# Patient Record
Sex: Male | Born: 1970 | Race: White | Hispanic: No | Marital: Married | Smoking: Never smoker
Health system: Southern US, Community
[De-identification: ages and names within clinical notes are randomized; demographics above are authoritative.]

---

## 2015-05-07 ENCOUNTER — Encounter: Payer: Self-pay | Admitting: Emergency Medicine

## 2015-05-07 ENCOUNTER — Emergency Department: Payer: BLUE CROSS/BLUE SHIELD

## 2015-05-07 ENCOUNTER — Observation Stay
Admission: EM | Admit: 2015-05-07 | Discharge: 2015-05-08 | Disposition: A | Discharge status: Home / Self Care | Payer: BLUE CROSS/BLUE SHIELD | Attending: Internal Medicine | Admitting: Internal Medicine

## 2015-05-07 DIAGNOSIS — Z882 Allergy status to sulfonamides status: Secondary | ICD-10-CM | POA: Diagnosis not present

## 2015-05-07 DIAGNOSIS — I208 Other forms of angina pectoris: Secondary | ICD-10-CM | POA: Diagnosis present

## 2015-05-07 DIAGNOSIS — J449 Chronic obstructive pulmonary disease, unspecified: Secondary | ICD-10-CM | POA: Diagnosis not present

## 2015-05-07 DIAGNOSIS — R0789 Other chest pain: Principal | ICD-10-CM | POA: Insufficient documentation

## 2015-05-07 DIAGNOSIS — R079 Chest pain, unspecified: Secondary | ICD-10-CM | POA: Diagnosis present

## 2015-05-07 DIAGNOSIS — Z79899 Other long term (current) drug therapy: Secondary | ICD-10-CM | POA: Insufficient documentation

## 2015-05-07 DIAGNOSIS — J45909 Unspecified asthma, uncomplicated: Secondary | ICD-10-CM

## 2015-05-07 DIAGNOSIS — R000 Tachycardia, unspecified: Secondary | ICD-10-CM | POA: Diagnosis not present

## 2015-05-07 DIAGNOSIS — E876 Hypokalemia: Secondary | ICD-10-CM

## 2015-05-07 DIAGNOSIS — R739 Hyperglycemia, unspecified: Secondary | ICD-10-CM

## 2015-05-07 DIAGNOSIS — R002 Palpitations: Secondary | ICD-10-CM | POA: Diagnosis present

## 2015-05-07 LAB — COMPREHENSIVE METABOLIC PANEL
ALT: 21 U/L (ref 17–63)
AST: 29 U/L (ref 15–41)
Albumin: 4.8 g/dL (ref 3.5–5.0)
Alkaline Phosphatase: 47 U/L (ref 38–126)
Anion gap: 9 (ref 5–15)
BILIRUBIN TOTAL: 0.8 mg/dL (ref 0.3–1.2)
BUN: 17 mg/dL (ref 6–20)
CHLORIDE: 105 mmol/L (ref 101–111)
CO2: 25 mmol/L (ref 22–32)
CREATININE: 1.06 mg/dL (ref 0.61–1.24)
Calcium: 9.2 mg/dL (ref 8.9–10.3)
Glucose, Bld: 114 mg/dL — ABNORMAL HIGH (ref 65–99)
POTASSIUM: 3 mmol/L — AB (ref 3.5–5.1)
Sodium: 139 mmol/L (ref 135–145)
TOTAL PROTEIN: 8.2 g/dL — AB (ref 6.5–8.1)

## 2015-05-07 LAB — TSH: TSH: 2.552 u[IU]/mL (ref 0.350–4.500)

## 2015-05-07 LAB — CBC
HCT: 40.4 % (ref 40.0–52.0)
Hemoglobin: 14.1 g/dL (ref 13.0–18.0)
MCH: 31.7 pg (ref 26.0–34.0)
MCHC: 34.8 g/dL (ref 32.0–36.0)
MCV: 91 fL (ref 80.0–100.0)
PLATELETS: 207 10*3/uL (ref 150–440)
RBC: 4.44 MIL/uL (ref 4.40–5.90)
RDW: 13 % (ref 11.5–14.5)
WBC: 5.9 10*3/uL (ref 3.8–10.6)

## 2015-05-07 LAB — TROPONIN I

## 2015-05-07 LAB — MAGNESIUM: Magnesium: 2.1 mg/dL (ref 1.7–2.4)

## 2015-05-07 MED ORDER — ALBUTEROL SULFATE (2.5 MG/3ML) 0.083% IN NEBU
2.5000 mg | INHALATION_SOLUTION | Freq: Four times a day (QID) | RESPIRATORY_TRACT | Status: DC
  Filled 2015-05-07 (×2): qty 3

## 2015-05-07 MED ORDER — ONDANSETRON HCL 4 MG/2ML IJ SOLN: 4.0000 mg | Freq: Four times a day (QID) | INTRAMUSCULAR | Status: DC | PRN

## 2015-05-07 MED ORDER — SODIUM CHLORIDE 0.9% FLUSH
3.0000 mL | Freq: Two times a day (BID) | INTRAVENOUS | Status: DC
  Administered 2015-05-07: 3 mL via INTRAVENOUS

## 2015-05-07 MED ORDER — NITROGLYCERIN 2 % TD OINT
0.5000 [in_us] | TOPICAL_OINTMENT | Freq: Three times a day (TID) | TRANSDERMAL | Status: DC
  Administered 2015-05-07: 0.5 [in_us] via TOPICAL
  Filled 2015-05-07 (×2): qty 1

## 2015-05-07 MED ORDER — SODIUM CHLORIDE 0.9 % IV SOLN: 250.0000 mL | INTRAVENOUS | Status: DC | PRN

## 2015-05-07 MED ORDER — SODIUM CHLORIDE 0.9% FLUSH: 3.0000 mL | INTRAVENOUS | Status: DC | PRN

## 2015-05-07 MED ORDER — POTASSIUM CHLORIDE CRYS ER 20 MEQ PO TBCR
40.0000 meq | EXTENDED_RELEASE_TABLET | Freq: Once | ORAL | Status: AC
  Administered 2015-05-07: 40 meq via ORAL
  Filled 2015-05-07: qty 2

## 2015-05-07 MED ORDER — ONDANSETRON HCL 4 MG PO TABS: 4.0000 mg | ORAL_TABLET | Freq: Four times a day (QID) | ORAL | Status: DC | PRN

## 2015-05-07 MED ORDER — ASPIRIN 81 MG PO CHEW
324.0000 mg | CHEWABLE_TABLET | Freq: Once | ORAL | Status: AC
  Administered 2015-05-07: 324 mg via ORAL
  Filled 2015-05-07: qty 4

## 2015-05-07 MED ORDER — METOPROLOL TARTRATE 25 MG PO TABS
12.5000 mg | ORAL_TABLET | Freq: Two times a day (BID) | ORAL | Status: DC
  Administered 2015-05-07 – 2015-05-08 (×2): 12.5 mg via ORAL
  Filled 2015-05-07 (×3): qty 1

## 2015-05-07 MED ORDER — ASPIRIN EC 81 MG PO TBEC
81.0000 mg | DELAYED_RELEASE_TABLET | Freq: Every day | ORAL | Status: DC
  Administered 2015-05-07 – 2015-05-08 (×2): 81 mg via ORAL
  Filled 2015-05-07: qty 1

## 2015-05-07 MED ORDER — NITROGLYCERIN 2 % TD OINT
1.0000 [in_us] | TOPICAL_OINTMENT | Freq: Once | TRANSDERMAL | Status: AC
  Administered 2015-05-07: 1 [in_us] via TOPICAL
  Filled 2015-05-07: qty 1

## 2015-05-07 MED ORDER — ACETAMINOPHEN 650 MG RE SUPP: 650.0000 mg | Freq: Four times a day (QID) | RECTAL | Status: DC | PRN

## 2015-05-07 MED ORDER — ENOXAPARIN SODIUM 40 MG/0.4ML ~~LOC~~ SOLN
40.0000 mg | SUBCUTANEOUS | Status: DC
  Administered 2015-05-07: 40 mg via SUBCUTANEOUS
  Filled 2015-05-07: qty 0.4

## 2015-05-07 MED ORDER — ACETAMINOPHEN 325 MG PO TABS
650.0000 mg | ORAL_TABLET | Freq: Four times a day (QID) | ORAL | Status: DC | PRN
  Administered 2015-05-08: 650 mg via ORAL
  Filled 2015-05-07: qty 2

## 2015-05-07 MED ORDER — SODIUM CHLORIDE 0.9% FLUSH
3.0000 mL | Freq: Two times a day (BID) | INTRAVENOUS | Status: DC
  Administered 2015-05-07 – 2015-05-08 (×2): 3 mL via INTRAVENOUS

## 2015-05-07 NOTE — ED Notes (Signed)
Pt reports [facility] for heart palpitations and fluttering sensation. Pt sent here for acute MI.

## 2015-05-07 NOTE — ED Notes (Signed)
Dr. Winona LegatoVaickute at bedside.

## 2015-05-07 NOTE — ED Notes (Signed)
Dr. Mayford KnifeWilliams at bedside.  Not a STEMI.

## 2015-05-07 NOTE — H&P (Signed)
[facility] - [place] at [facility]   PATIENT NAME: Manuel Cabrera    MR#:  [id number]  DATE OF BIRTH:  [birth year]  DATE OF ADMISSION:  05/07/2015  PRIMARY CARE PHYSICIAN: Marisue Ivan, MD   REQUESTING/REFERRING PHYSICIAN:   CHIEF COMPLAINT:   Chief Complaint  Patient presents with  . Palpitations    HISTORY OF PRESENT ILLNESS: Manuel Cabrera  is a 45 y.o. male with a known history of childhood asthma, who presents to the hospital with complaints of intermittent chest pains, palpitations. According to the patient,  he was doing well up until a few weeks ago when he started having intermittent palpitations, spreading to his heart, lasting for a few seconds, feeling like his heart would stop , flutter . Episodes of palpitations would last only a few seconds and would go away. 3 weeks ago. However, he noted that it was very difficult for him to play basketball with his son, he had the pressure in his chest area and had increased heart rate, approximately week ago, he was carrying his child and the sided having chest pain again. He had to put child on the ground and walk really slow for chest pain to improve. Pain is described as pressure type discomfort and left side of the chest, usually 3-4 out of 10 by intensity discomfort lasting approximately 10 minutes or less constant with no radiation. There is no alleviating or aggravating factors except of exertion. Patient was seen by Dr. Burnadette Pop, ordered EKG in the office. EKG came back abnormal with ST, T elevations in anterior lateral leads and patient was referred to emergency room for further evaluation. EKG in the emergency room was unremarkable. Hospitalist services were contacted for admission  PAST MEDICAL HISTORY:  History reviewed. No pertinent past medical history.  PAST SURGICAL HISTORY: History reviewed. No pertinent past surgical history.  SOCIAL HISTORY:  Social History  Substance Use Topics  .  Smoking status: Never Smoker   . Smokeless tobacco: Not on file  . Alcohol Use: No    FAMILY HISTORY: No early coronary artery disease, hypertension .  DRUG ALLERGIES:  Allergies  Allergen Reactions  . Sulfa Antibiotics Other (See Comments)    Reaction:  Unknown     Review of Systems  Constitutional: Negative for fever, chills, weight loss and malaise/fatigue.  HENT: Negative for congestion.   Eyes: Negative for blurred vision and double vision.  Respiratory: Negative for cough, sputum production, shortness of breath and wheezing.   Cardiovascular: Positive for chest pain and palpitations. Negative for orthopnea, leg swelling and PND.  Gastrointestinal: Negative for nausea, vomiting, abdominal pain, diarrhea, constipation, blood in stool and melena.  Genitourinary: Negative for dysuria, urgency, frequency and hematuria.  Musculoskeletal: Negative for falls.  Skin: Negative for rash.  Neurological: Negative for dizziness and weakness.  Psychiatric/Behavioral: Negative for depression and memory loss. The patient is not nervous/anxious.     MEDICATIONS AT HOME:  Prior to Admission medications   Medication Sig Start Date End Date Taking? Authorizing Provider  naproxen sodium (ANAPROX) 220 MG tablet Take 440 mg by mouth 2 (two) times daily as needed (for pain).   Yes Historical Provider, MD      PHYSICAL EXAMINATION:   VITAL SIGNS: Blood pressure 122/73, pulse 83, temperature 98.2 F (36.8 C), temperature source Oral, resp. rate 18, height  (1.778 m), weight 71.8 kg (158 lb 4.6 oz), SpO2 99 %.  GENERAL:  45 y.o.-year-old patient lying in the bed with no acute  distress.  EYES: Pupils equal, round, reactive to light and accommodation. No scleral icterus. Extraocular muscles intact.  HEENT: Head atraumatic, normocephalic. Oropharynx and nasopharynx clear.  NECK:  Supple, no jugular venous distention. No thyroid enlargement, no tenderness.  LUNGS: Normal breath sounds  bilaterally, no wheezing, rales,rhonchi or crepitation. No use of accessory muscles of respiration.  CARDIOVASCULAR: S1, S2 normal. No murmurs, rubs, or gallops.  ABDOMEN: Soft, nontender, nondistended. Bowel sounds present. No organomegaly or mass.  EXTREMITIES: No pedal edema, cyanosis, or clubbing.  NEUROLOGIC: Cranial nerves II through XII are intact. Muscle strength 5/5 in all extremities. Sensation intact. Gait not checked.  PSYCHIATRIC: The patient is alert and oriented x 3.  SKIN: No obvious rash, lesion, or ulcer.   LABORATORY PANEL:   CBC  Recent Labs Lab 05/07/15 1239  WBC 5.9  HGB 14.1  HCT 40.4  PLT 207  MCV 91.0  MCH 31.7  MCHC 34.8  RDW 13.0   ------------------------------------------------------------------------------------------------------------------  Chemistries   Recent Labs Lab 05/07/15 1239  NA 139  K 3.0*  CL 105  CO2 25  GLUCOSE 114*  BUN 17  CREATININE 1.06  CALCIUM 9.2  AST 29  ALT 21  ALKPHOS 47  BILITOT 0.8   ------------------------------------------------------------------------------------------------------------------  Cardiac Enzymes  Recent Labs Lab 05/07/15 1239  TROPONINI <0.03   ------------------------------------------------------------------------------------------------------------------  RADIOLOGY: Dg Chest Port 1 View  05/07/2015  CLINICAL DATA:  Chest pain and heart palpitations for several days, abnormal EKG EXAM: PORTABLE CHEST 1 VIEW COMPARISON:  None. FINDINGS: The heart size and vascular pattern are normal. Lungs are clear. There appears to be an element of hyperinflation suggesting COPD. Costophrenic angles are not included on the image. IMPRESSION: COPD with no acute abnormalities Electronically Signed   By: Esperanza Heiraymond  Rubner M.D.   On: 05/07/2015 14:21    EKG: Orders placed or performed during the hospital encounter of 05/07/15  . EKG 12-Lead  . EKG 12-Lead  . EKG 12-Lead  . EKG 12-Lead  . ED EKG  .  ED EKG  . ED EKG  . ED EKG    IMPRESSION AND PLAN:  Active Problems:   Chest pain  #1. Chest pain, exertional, rule out cardiac. Admit patient to medical floor. Initiate him on full dose of aspirin, metoprolol, nitroglycerin and Lovenox, get Myoview stress test in the morning #2, palpitations, continue telemetry, unremarkable TSH, get echo, ged cardiologist involved for further recommendations #3 . Hyperglycemia, get a hemoglobin A1c to rule out diabetes. #4. Hypokalemia, supplement orally, get magnesium level checked   All the records are reviewed and case discussed with ED provider. Management plans discussed with the patient, family and they are in agreement.  CODE STATUS: Code Status History    This patient does not have a recorded code status. Please follow your organizational policy for patients in this situation.       TOTAL TIME TAKING CARE OF THIS PATIENT: 50 minutes.    Katharina CaperVAICKUTE,Alexus Piotr M.D on 05/07/2015 at 4:58 PM  Between 7am to 6pm - Pager - [phone] After 6pm go to [email or web address] - password [email or web address] [facility]  [facility]  Office  [phone]  CC: Primary care physician; Marisue IvanLINTHAVONG, KANHKA, MD

## 2015-05-07 NOTE — ED Provider Notes (Signed)
[facility] Emergency Department Provider Note     Time seen: ----------------------------------------- 1:05 PM on 05/07/2015 -----------------------------------------    I have reviewed the triage vital signs and the nursing notes.   HISTORY  Chief Complaint Palpitations    HPI Manuel Cabrera is a 45 y.o. male who presents to the ER for her palpitations and fluttering sensation. He was sent here for possible acute heart attack by his primary care doctor. Patient does describe occasional stable angina symptoms of chest pain with exertion or heavy physical activity in the past few weeks but has never had chest pain at rest. He does not have pain currently, no nausea or sweats with the symptoms.   History reviewed. No pertinent past medical history.  There are no active problems to display for this patient.   History reviewed. No pertinent past surgical history.  Allergies Sulfa antibiotics  Social History Social History  Substance Use Topics  . Smoking status: Never Smoker   . Smokeless tobacco: None  . Alcohol Use: No    Review of Systems Constitutional: Negative for fever. Eyes: Negative for visual changes. ENT: Negative for sore throat. Cardiovascular: Negative for chest pain. Positive for palpitations Respiratory: Negative for shortness of breath. Gastrointestinal: Negative for abdominal pain, vomiting and diarrhea. Genitourinary: Negative for dysuria. Musculoskeletal: Negative for back pain. Skin: Negative for rash. Neurological: Negative for headaches, focal weakness or numbness.  10-point ROS otherwise negative.  ____________________________________________   PHYSICAL EXAM:  VITAL SIGNS: ED Triage Vitals  Enc Vitals Group     BP 05/07/15 1241 156/85 mmHg     Pulse Rate 05/07/15 1241 104     Resp 05/07/15 1241 20     Temp 05/07/15 1241 98.2 F (36.8 C)     Temp Source 05/07/15 1241 Oral     SpO2 05/07/15 1241 98 %     Weight 05/07/15 1238 158 lb 4.6 oz (71.8 kg)     Height 05/07/15 1241  (1.778 m)     Head Cir --      Peak Flow --      Pain Score 05/07/15 1252 0     Pain Loc --      Pain Edu? --      Excl. in GC? --     Constitutional: Alert and oriented. Well appearing and in no distress. Eyes: Conjunctivae are normal. PERRL. Normal extraocular movements. ENT   Head: Normocephalic and atraumatic.   Nose: No congestion/rhinnorhea.   Mouth/Throat: Mucous membranes are moist.   Neck: No stridor. Cardiovascular: Normal rate, regular rhythm. Normal and symmetric distal pulses are present in all extremities. No murmurs, rubs, or gallops. Respiratory: Normal respiratory effort without tachypnea nor retractions. Breath sounds are clear and equal bilaterally. No wheezes/rales/rhonchi. Gastrointestinal: Soft and nontender. No distention. No abdominal bruits.  Musculoskeletal: Nontender with normal range of motion in all extremities. No joint effusions.  No lower extremity tenderness nor edema. Neurologic:  Normal speech and language. No gross focal neurologic deficits are appreciated. Speech is normal. No gait instability. Skin:  Skin is warm, dry and intact. No rash noted. Psychiatric: Mood and affect are normal. Speech and behavior are normal. Patient exhibits appropriate insight and judgment. ____________________________________________  EKG: Interpreted by me. Normal sinus rhythm with a rate of 77 bpm, normal PR interval, normal QRS, normal QT interval. J-point elevation in leads V3 through V6  ____________________________________________  ED COURSE:  Pertinent labs & imaging results that were available during my care of the patient  were reviewed by me and considered in my medical decision making (see chart for details). Patient is in no acute distress, does not have symptomatology consistent with STEMI. We'll check cardiac labs and discuss with cardiology  ____________________________________________    LABS (pertinent positives/negatives)  Labs Reviewed  COMPREHENSIVE METABOLIC PANEL - Abnormal; Notable for the following:    Potassium 3.0 (*)    Glucose, Bld 114 (*)    Total Protein 8.2 (*)    All other components within normal limits  CBC  TROPONIN I  TSH    RADIOLOGY Images were viewed by me  Chest x-ray  IMPRESSION: COPD with no acute abnormalities  ____________________________________________  FINAL ASSESSMENT AND PLAN  Palpitations, stable angina  Plan: Patient with labs and imaging as dictated above. Patient is in no acute distress, I have discussed the EKG initially with Dr. Lady GaryFath who agrees this is not an ST elevation MI. Patient does exhibit some stable angina symptoms that are getting worse, he's been given aspirin and nitroglycerin. Would recommend observation, serial troponins and stress testing.   Emily FilbertWilliams, Dominic Mahaney E, MD   Emily FilbertJonathan E Jarboe, MD 05/07/15 1520

## 2015-05-08 ENCOUNTER — Observation Stay: Payer: BLUE CROSS/BLUE SHIELD

## 2015-05-08 ENCOUNTER — Observation Stay: Admit: 2015-05-08 | Payer: BLUE CROSS/BLUE SHIELD

## 2015-05-08 LAB — BASIC METABOLIC PANEL
ANION GAP: 3 — AB (ref 5–15)
BUN: 19 mg/dL (ref 6–20)
CALCIUM: 8.8 mg/dL — AB (ref 8.9–10.3)
CO2: 27 mmol/L (ref 22–32)
Chloride: 109 mmol/L (ref 101–111)
Creatinine, Ser: 1.07 mg/dL (ref 0.61–1.24)
GFR calc Af Amer: >60 mL/min (ref >60)
Glucose, Bld: 100 mg/dL — ABNORMAL HIGH (ref 65–99)
POTASSIUM: 4.3 mmol/L (ref 3.5–5.1)
SODIUM: 139 mmol/L (ref 135–145)

## 2015-05-08 LAB — LIPID PANEL
CHOL/HDL RATIO: 3.5 ratio
CHOLESTEROL: 186 mg/dL (ref 0–200)
HDL: 53 mg/dL (ref >40)
LDL Cholesterol: 116 mg/dL — ABNORMAL HIGH (ref 0–99)
Triglycerides: 83 mg/dL (ref <150)
VLDL: 17 mg/dL (ref 0–40)

## 2015-05-08 LAB — NM MYOCAR MULTI W/SPECT W/WALL MOTION / EF
CHL CUP NUCLEAR SDS: 0
LV dias vol: 64 mL
LVSYSVOL: 19 mL
SRS: 2
SSS: 0
TID: 0.95

## 2015-05-08 LAB — CBC
HEMATOCRIT: 36.1 % — AB (ref 40.0–52.0)
HEMOGLOBIN: 12.6 g/dL — AB (ref 13.0–18.0)
MCH: 31.5 pg (ref 26.0–34.0)
MCHC: 34.9 g/dL (ref 32.0–36.0)
MCV: 90.4 fL (ref 80.0–100.0)
Platelets: 177 10*3/uL (ref 150–440)
RBC: 4 MIL/uL — ABNORMAL LOW (ref 4.40–5.90)
RDW: 13.3 % (ref 11.5–14.5)
WBC: 6.7 10*3/uL (ref 3.8–10.6)

## 2015-05-08 LAB — TROPONIN I

## 2015-05-08 LAB — HEMOGLOBIN A1C: HEMOGLOBIN A1C: 5.3 % (ref 4.0–6.0)

## 2015-05-08 MED ORDER — TECHNETIUM TC 99M SESTAMIBI - CARDIOLITE
29.7320 | Freq: Once | INTRAVENOUS | Status: AC | PRN
  Administered 2015-05-08: 13:00:00 29.732 via INTRAVENOUS

## 2015-05-08 MED ORDER — ALBUTEROL SULFATE (2.5 MG/3ML) 0.083% IN NEBU: 2.5000 mg | INHALATION_SOLUTION | Freq: Four times a day (QID) | RESPIRATORY_TRACT | Status: DC | PRN

## 2015-05-08 MED ORDER — ASPIRIN 81 MG PO TBEC: 81.0000 mg | DELAYED_RELEASE_TABLET | Freq: Every day | ORAL | Status: AC

## 2015-05-08 MED ORDER — METOPROLOL TARTRATE 25 MG PO TABS: 12.5000 mg | ORAL_TABLET | Freq: Two times a day (BID) | ORAL | Status: AC

## 2015-05-08 MED ORDER — TECHNETIUM TC 99M SESTAMIBI - CARDIOLITE
13.9500 | Freq: Once | INTRAVENOUS | Status: AC | PRN
  Administered 2015-05-08: 13.95 via INTRAVENOUS

## 2015-05-08 NOTE — Progress Notes (Signed)
Informed that patient need two troponin in order to proceed with stress test this morning Dr. Luberta RobertsonPaged, no response as of yet.

## 2015-05-08 NOTE — Consult Note (Signed)
[facility]  CARDIOLOGY CONSULT NOTE  Patient ID: Manuel ConnorsMichael Hopkin MRN: [id number] DOB/AGE: 45/10/1970 45 y.o.  Admit date: 05/07/2015 Referring Physician Dr. Allena KatzPatel Primary Physician Dr. Burnadette PopLinthavong Primary Cardiologist  Reason for Consultation chest pain  HPI: Patient is a 45 year old male with no prior cardiac history presented to his primary care provider's office with complaints of palpitations and midsternal chest tightness that occurred while playing basketball with his son or carrying his young daughter. These symptoms resolved with rest. In the primary care provider's office, his EKG showed lateral ST elevation consistent with J-point elevation. He was sent to the emergency room where EKG was normal as were subsequent EKGs. Serum troponin was normal. No further chest pain. He feels palpitations but no arrhythmia. He denies syncope or presyncope. His risk factors are family history but denies tobacco abuse, hypertension and hyperlipidemia. 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  Review of Systems  Constitutional: Negative.   HENT: Negative.   Eyes: Negative.   Respiratory: Negative.   Cardiovascular: Positive for chest pain and palpitations.  Gastrointestinal: Negative.   Genitourinary: Negative.   Musculoskeletal: Negative.   Skin: Negative.   Neurological: Negative.   Endo/Heme/Allergies: Negative.   Psychiatric/Behavioral: Negative.     History reviewed. No pertinent past medical history.  History reviewed. No pertinent family history.  Social History   Social History  . Marital Status: Married    Spouse Name: N/A  . Number of Children: N/A  . Years of Education: N/A   Occupational History  . Not on file.   Social History Main Topics  . Smoking status: Never Smoker   . Smokeless tobacco: Not on file  . Alcohol Use: No  . Drug Use: Not on file  . Sexual Activity: Not on file   Other Topics Concern  . Not on file   Social  History Narrative  . No narrative on file    History reviewed. No pertinent past surgical history.   Prescriptions prior to admission  Medication Sig Dispense Refill Last Dose  . naproxen sodium (ANAPROX) 220 MG tablet Take 440 mg by mouth 2 (two) times daily as needed (for pain).   Past Month at Unknown time    Physical Exam: Blood pressure 99/61, pulse 72, temperature 98.2 F (36.8 C), temperature source Oral, resp. rate 20, height 5\' 10"  (1.778 m), weight 68.811 kg (151 lb 11.2 oz), SpO2 98 %.   Wt Readings from Last 1 Encounters:  05/07/15 68.811 kg (151 lb 11.2 oz)     General appearance: alert and cooperative Resp: clear to auscultation bilaterally Chest wall: no tenderness Cardio: regular rate and rhythm, S1, S2 normal, no murmur, click, rub or gallop GI: soft, non-tender; bowel sounds normal; no masses,  no organomegaly Extremities: extremities normal, atraumatic, no cyanosis or edema Pulses: 2+ and symmetric Neurologic: Grossly normal  Labs:   Lab Results  Component Value Date   WBC 6.7 05/08/2015   HGB 12.6* 05/08/2015   HCT 36.1* 05/08/2015   MCV 90.4 05/08/2015   PLT 177 05/08/2015    Recent Labs Lab 05/07/15 1239 05/08/15 0514  NA 139 139  K 3.0* 4.3  CL 105 109  CO2 25 27  BUN 17 19  CREATININE 1.06 1.07  CALCIUM 9.2 8.8*  PROT 8.2*  --   BILITOT 0.8  --   ALKPHOS 47  --   ALT 21  --   AST 29  --   GLUCOSE 114* 100*  Lab Results  Component Value Date   TROPONINI <0.03 05/08/2015      Radiology: Chest x-ray shows no acute process EKG: EKG shows sinus rhythm nonspecific ST-T wave changes  ASSESSMENT AND PLAN: 45 year old male with no prior cardiac history presented to the emergency room after being evaluated in his primary care provider's office where an EKG was abnormal. Patient has some exertional chest discomfort but no rest pain. He has had a history of palpitations. He ruled out for myocardial infarction. His initial serum potassium was  3.0. Is now 4.3. Risk factors include family history but otherwise negative. We'll proceed with an ETT sestamibi today determine if there is evidence of ischemia. If there is any significant abnormalities would proceed with left cardiac catheterization. If normal would discharge to home with outpatient follow-up. Enteric-coated aspirin and low-dose beta blocker.   Signed: Dalia Heading MD, [facility] 05/08/2015, 1:25 PM

## 2015-05-08 NOTE — Plan of Care (Signed)
Problem: Consults Goal: Chest Pain Patient Education (See Patient Education module for education specifics.) Outcome: Completed/Met Date Met:  05/08/15 Handouts given to patient, related to patient's diagnoses. Will continue to monitor.

## 2015-05-08 NOTE — Discharge Summary (Signed)
[facility] [place] [facility]   PATIENT NAME: Manuel ConnorsMichael Cabrera    MR#:  [id number]  DATE OF BIRTH:  06/27/1970  DATE OF ADMISSION:  05/07/2015 ADMITTING PHYSICIAN: Manuel Caperima Vaickute, MD  DATE OF DISCHARGE: 05/08/2015  PRIMARY CARE PHYSICIAN: Manuel Cabrera, KANHKA, MD    ADMISSION DIAGNOSIS:  Palpitations [R00.2] Stable angina (HCC) [I20.8] Chest pain [R07.9]  DISCHARGE DIAGNOSIS:  Palpitation resolved chest pain resolved  SECONDARY DIAGNOSIS:  History reviewed. No pertinent past medical history.  HOSPITAL COURSE:  #1. Chest pain, exertional, rule out cardiac. -So far workup has been essentially negative with 3 sets of negative cardiac enzymes. Telemetry monitoring shows normal sinus rhythm.  -Seen by Dr. Lady Garyfath. Myoview stress test. #2, palpitations, continue telemetry, unremarkable  -Normal TSH  #4. Hypokalemia repleted orally  CONSULTS OBTAINED:  Treatment Team:  Dalia HeadingKenneth A Fath, MD  DRUG ALLERGIES:   Allergies  Allergen Reactions  . Sulfa Antibiotics Other (See Comments)    Reaction:  Unknown     DISCHARGE MEDICATIONS:   Current Discharge Medication List    START taking these medications   Details  aspirin EC 81 MG EC tablet Take 1 tablet (81 mg total) by mouth daily. Qty: 30 tablet, Refills: 0    metoprolol tartrate (LOPRESSOR) 25 MG tablet Take 0.5 tablets (12.5 mg total) by mouth 2 (two) times daily. Qty: 60 tablet, Refills: 0      CONTINUE these medications which have NOT CHANGED   Details  naproxen sodium (ANAPROX) 220 MG tablet Take 440 mg by mouth 2 (two) times daily as needed (for pain).        If you experience worsening of your admission symptoms, develop shortness of breath, life threatening emergency, suicidal or homicidal thoughts you must seek medical attention immediately by calling 911 or calling your MD immediately  if symptoms less severe.  You Must read complete instructions/literature along with all the  possible adverse reactions/side effects for all the Medicines you take and that have been prescribed to you. Take any new Medicines after you have completely understood and accept all the possible adverse reactions/side effects.   Please note  You were cared for by a hospitalist during your hospital stay. If you have any questions about your discharge medications or the care you received while you were in the hospital after you are discharged, you can call the unit and asked to speak with the hospitalist on call if the hospitalist that took care of you is not available. Once you are discharged, your primary care physician will handle any further medical issues. Please note that NO REFILLS for any discharge medications will be authorized once you are discharged, as it is imperative that you return to your primary care physician (or establish a relationship with a primary care physician if you do not have one) for your aftercare needs so that they can reassess your need for medications and monitor your lab values. Today   SUBJECTIVE    no complaints   VITAL SIGNS:  Blood pressure 119/65, pulse 82, temperature 98.4 F (36.9 C), temperature source Oral, resp. rate 16, height 5\' 10"  (1.778 m), weight 68.811 kg (151 lb 11.2 oz), SpO2 100 %.  I/O:  No intake or output data in the 24 hours ending 05/08/15 1423  PHYSICAL EXAMINATION:  GENERAL:  45 y.o.-year-old patient lying in the bed with no acute distress.  EYES: Pupils equal, round, reactive to light and accommodation. No scleral icterus. Extraocular muscles intact.  HEENT: Head  atraumatic, normocephalic. Oropharynx and nasopharynx clear.  NECK:  Supple, no jugular venous distention. No thyroid enlargement, no tenderness.  LUNGS: Normal breath sounds bilaterally, no wheezing, rales,rhonchi or crepitation. No use of accessory muscles of respiration.  CARDIOVASCULAR: S1, S2 normal. No murmurs, rubs, or gallops.  ABDOMEN: Soft, non-tender,  non-distended. Bowel sounds present. No organomegaly or mass.  EXTREMITIES: No pedal edema, cyanosis, or clubbing.  NEUROLOGIC: Cranial nerves II through XII are intact. Muscle strength 5/5 in all extremities. Sensation intact. Gait not checked.  PSYCHIATRIC: The patient is alert and oriented x 3.  SKIN: No obvious rash, lesion, or ulcer.   DATA REVIEW:   CBC   Recent Labs Lab 05/08/15 0514  WBC 6.7  HGB 12.6*  HCT 36.1*  PLT 177    Chemistries   Recent Labs Lab 05/07/15 1239 05/07/15 1252 05/08/15 0514  NA 139  --  139  K 3.0*  --  4.3  CL 105  --  109  CO2 25  --  27  GLUCOSE 114*  --  100*  BUN 17  --  19  CREATININE 1.06  --  1.07  CALCIUM 9.2  --  8.8*  MG  --  2.1  --   AST 29  --   --   ALT 21  --   --   ALKPHOS 47  --   --   BILITOT 0.8  --   --     Microbiology Results   No results found for this or any previous visit (from the past 240 hour(s)).  RADIOLOGY:  Dg Chest Port 1 View  05/07/2015  CLINICAL DATA:  Chest pain and heart palpitations for several days, abnormal EKG EXAM: PORTABLE CHEST 1 VIEW COMPARISON:  None. FINDINGS: The heart size and vascular pattern are normal. Lungs are clear. There appears to be an element of hyperinflation suggesting COPD. Costophrenic angles are not included on the image. IMPRESSION: COPD with no acute abnormalities Electronically Signed   By: Esperanza Heir M.D.   On: 05/07/2015 14:21     Management plans discussed with the patient, family and they are in agreement.  CODE STATUS:     Code Status Orders        Start     Ordered   05/07/15 2024  Full code   Continuous     05/07/15 2023    Code Status History    Date Active Date Inactive Code Status Order ID Comments User Context   This patient has a current code status but no historical code status.      TOTAL TIME TAKING CARE OF THIS PATIENT: *40*inutes.    Charne Mcbrien M.D on 05/08/2015 at 2:23 PM  Between 7am to 6pm - Pager - [phone] After 6pm  go to [email or web address] - password [email or web address] [facility]  [place] [facility]  Office  [phone]  CC: Primary care physician; Manuel Ivan, MD

## 2015-05-08 NOTE — Progress Notes (Signed)
Stress test resulted, shows low risk study. Dr. Allena KatzPatel paged and updated, to discharge patient. Prescriptions have been sent to patients pharmacy.

## 2015-05-08 NOTE — Progress Notes (Signed)
Per Dr. Lady GaryFath, patient does not need to take metoprolol. Dr. Lady GaryFath discussed this with patient. Discharge papers still include metoprolol and Dr. Lady GaryFath reinforced with patient that while d/c papers still list metoprolol, he does not want patient to take this medication. This RN also reinforced this with review of discharge papers. Patient and spouse verbalize understanding.

## 2015-05-08 NOTE — Progress Notes (Signed)
Pt. Discharged to home via wc. Discharge instructions and medication regimen reviewed at bedside with patient and spouse. Both verbalize understanding of instructions and medication regimen. Prescriptions called into pharmacy, pt and wife aware. Reiterated with patient and spouse that Dr. Lady GaryFath does not want him to take metoprolol and family again verbalized understanding of this. Patient assessment unchanged from this morning. TELE and IV discontinued per policy.

## 2020-02-13 ENCOUNTER — Ambulatory Visit: Payer: BLUE CROSS/BLUE SHIELD | Attending: Internal Medicine

## 2020-02-13 DIAGNOSIS — Z23 Encounter for immunization: Secondary | ICD-10-CM

## 2020-02-13 NOTE — Progress Notes (Signed)
   [facility] Vaccination [facility]  Name:  Manuel Cabrera    MRN: [id number] DOB: January 04, 1971  02/13/2020  [name] was observed post Covid-19 immunization for 15 minutes without incident. He was provided with Vaccine Information Sheet and instruction to access the V-Safe system.   [name] was instructed to call 911 with any severe reactions post vaccine: Marland Kitchen Difficulty breathing  . Swelling of face and throat  . A fast heartbeat  . A bad rash all over body  . Dizziness and weakness   Immunizations Administered    Name Date Dose VIS Date Route   Pfizer COVID-19 Vaccine 02/13/2020  2:07 PM 0.3 mL 12/21/2019 Intramuscular   Manufacturer: [employer], [employer]   Lot: [id number]   NDC: [address]-4
